# Patient Record
Sex: Male | Born: 1960 | Race: Asian | Hispanic: No | Marital: Married | Smoking: Never smoker
Health system: Southern US, Community
[De-identification: ages and names within clinical notes are randomized; demographics above are authoritative.]

## PROBLEM LIST (undated history)

## (undated) DIAGNOSIS — T7840XA Allergy, unspecified, initial encounter: Secondary | ICD-10-CM

## (undated) DIAGNOSIS — E079 Disorder of thyroid, unspecified: Secondary | ICD-10-CM

## (undated) DIAGNOSIS — E785 Hyperlipidemia, unspecified: Secondary | ICD-10-CM

## (undated) HISTORY — DX: Hyperlipidemia, unspecified: E78.5

## (undated) HISTORY — DX: Allergy, unspecified, initial encounter: T78.40XA

## (undated) HISTORY — DX: Disorder of thyroid, unspecified: E07.9

---

## 2000-06-26 ENCOUNTER — Encounter: Admission: RE | Admit: 2000-06-26 | Discharge: 2000-06-26 | Payer: Self-pay | Admitting: Family Medicine

## 2000-06-26 ENCOUNTER — Encounter: Payer: Self-pay | Admitting: Family Medicine

## 2006-05-08 ENCOUNTER — Emergency Department (HOSPITAL_COMMUNITY): Admission: EM | Admit: 2006-05-08 | Discharge: 2006-05-09 | Payer: Self-pay | Admitting: Emergency Medicine

## 2008-11-09 ENCOUNTER — Encounter: Admission: RE | Admit: 2008-11-09 | Discharge: 2008-11-09 | Payer: Self-pay | Admitting: Family Medicine

## 2013-03-09 ENCOUNTER — Other Ambulatory Visit (HOSPITAL_COMMUNITY): Payer: Self-pay | Admitting: Orthopedic Surgery

## 2013-03-09 DIAGNOSIS — M67431 Ganglion, right wrist: Secondary | ICD-10-CM

## 2013-03-19 ENCOUNTER — Other Ambulatory Visit (HOSPITAL_COMMUNITY): Payer: Self-pay

## 2013-03-19 ENCOUNTER — Ambulatory Visit (HOSPITAL_COMMUNITY): Payer: BC Managed Care – PPO

## 2013-03-20 ENCOUNTER — Ambulatory Visit (HOSPITAL_COMMUNITY)
Admission: RE | Admit: 2013-03-20 | Discharge: 2013-03-20 | Disposition: A | Discharge status: Home / Self Care | Payer: BC Managed Care – PPO | Source: Ambulatory Visit | Attending: Orthopedic Surgery | Admitting: Orthopedic Surgery

## 2013-03-20 ENCOUNTER — Other Ambulatory Visit (HOSPITAL_COMMUNITY): Payer: Self-pay | Admitting: Orthopedic Surgery

## 2013-03-20 DIAGNOSIS — M67431 Ganglion, right wrist: Secondary | ICD-10-CM

## 2013-03-20 DIAGNOSIS — M25539 Pain in unspecified wrist: Secondary | ICD-10-CM | POA: Insufficient documentation

## 2014-03-17 IMAGING — US US EXTREM UP *R* LTD
1 series · 14 of 14 positions shown · non-contrast
Comparison: None.

CLINICAL DATA: Right wrist pain while doing pushups.  Question
ganglion cyst.

ULTRASOUND RIGHT UPPER EXTREMITY LIMITED
TECHNIQUE: Ultrasound examination of the region of interest in the
right upper extremity was performed.

[Series 2: us extrem up *right* ltd · 0.07mm/px · 14 of 14 slices shown]
[im 1/14]
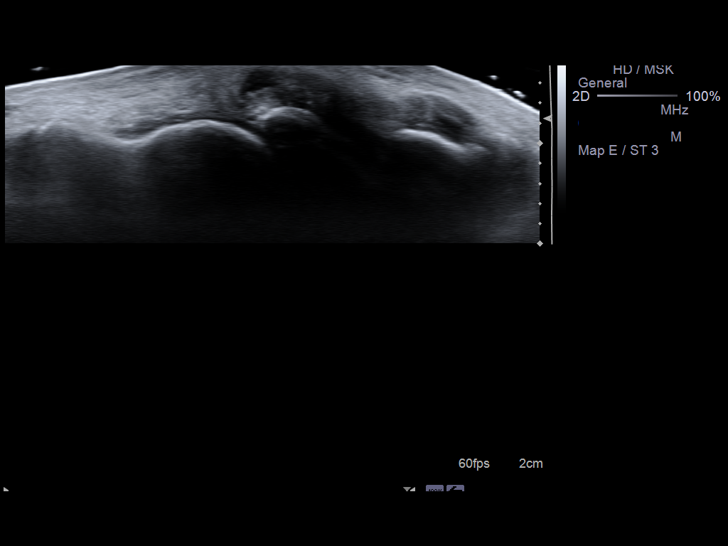
[im 2/14]
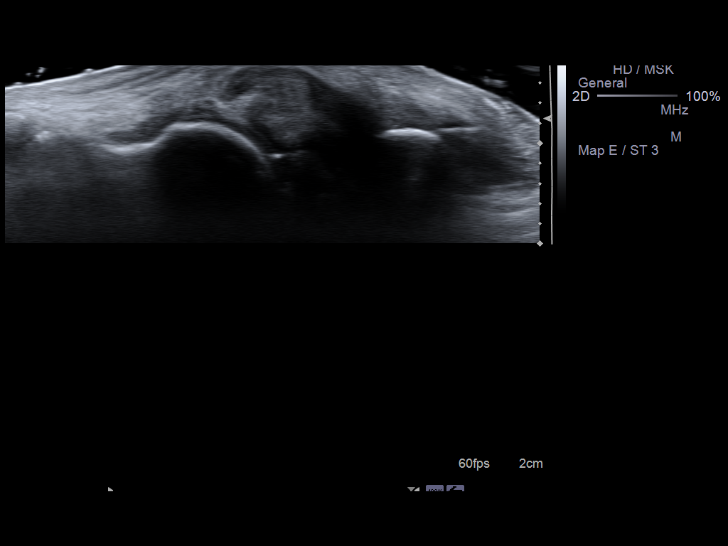
[im 3/14]
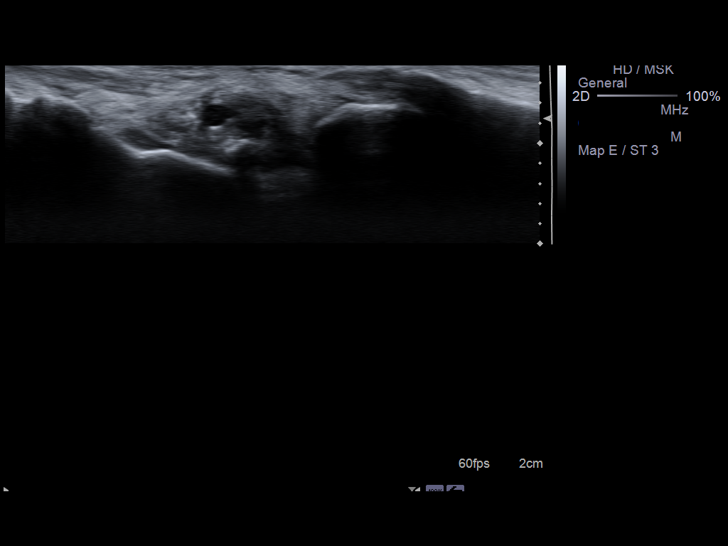
[im 4/14]
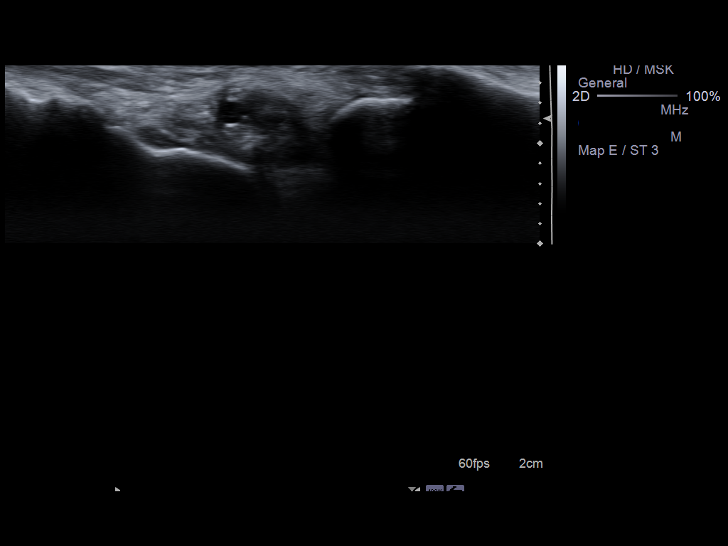
[im 5/14]
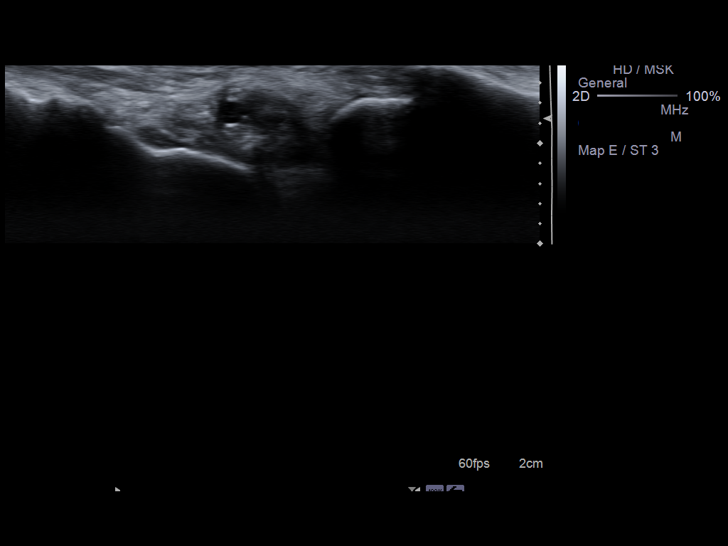
[im 6/14]
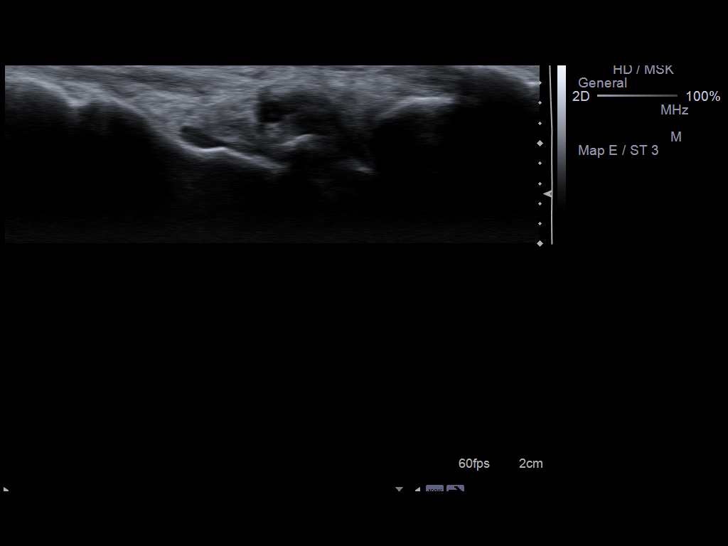
[im 7/14]
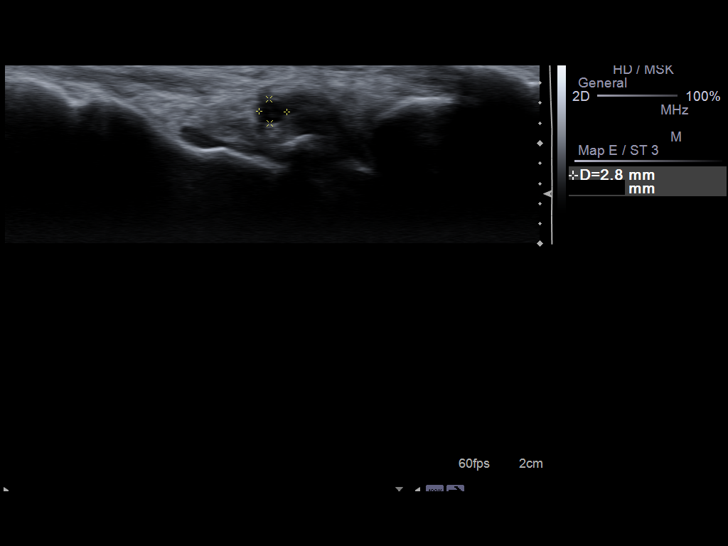
[im 8/14]
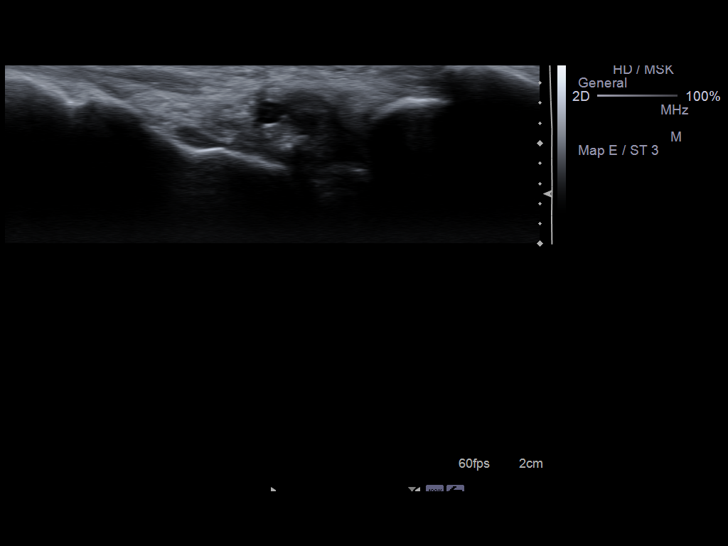
[im 9/14]
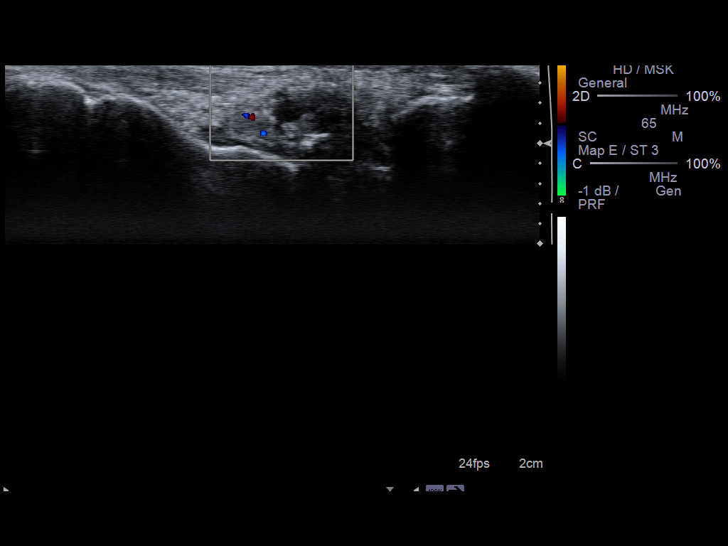
[im 10/14]
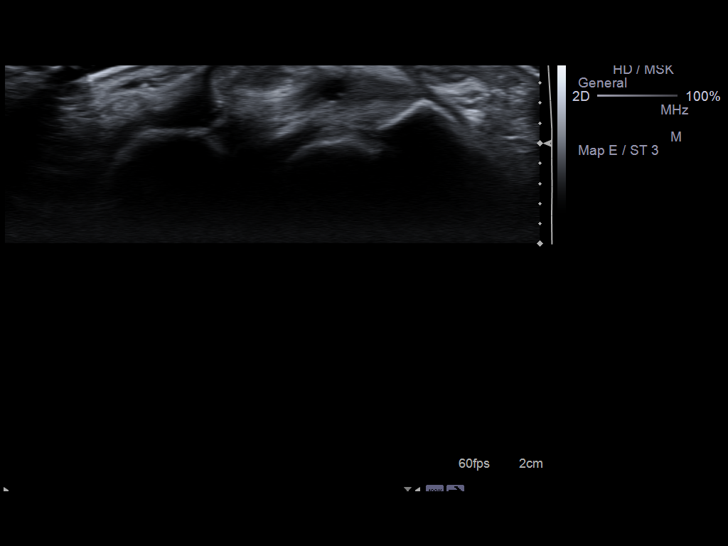
[im 11/14]
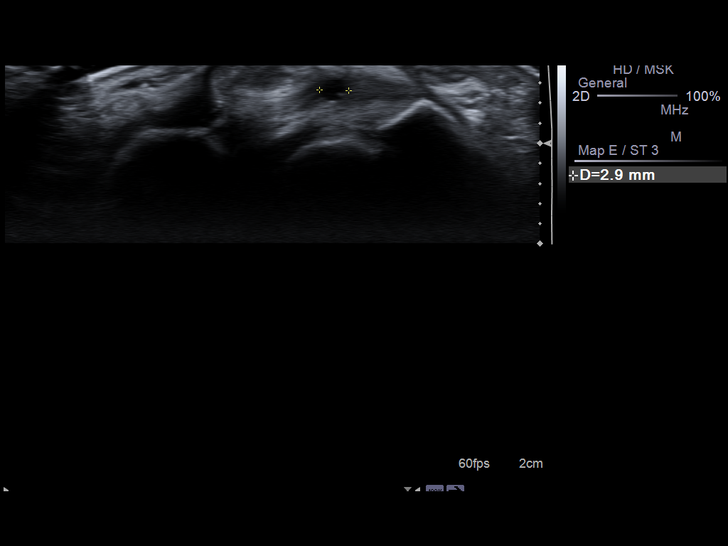
[im 12/14]
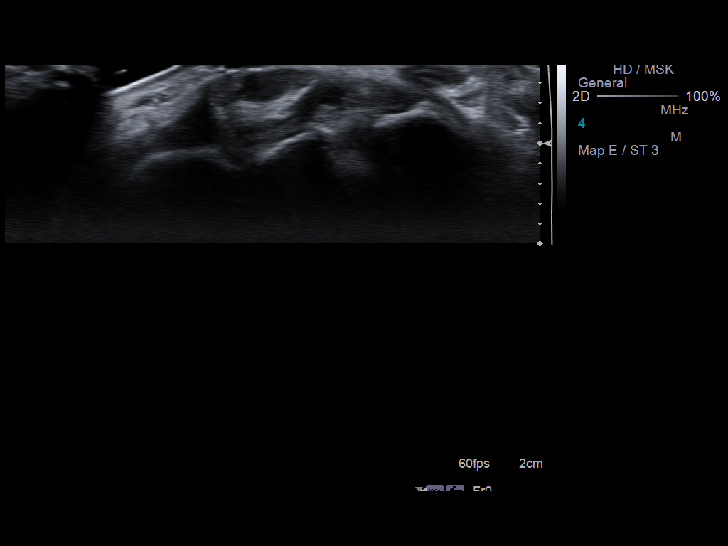
[im 13/14]
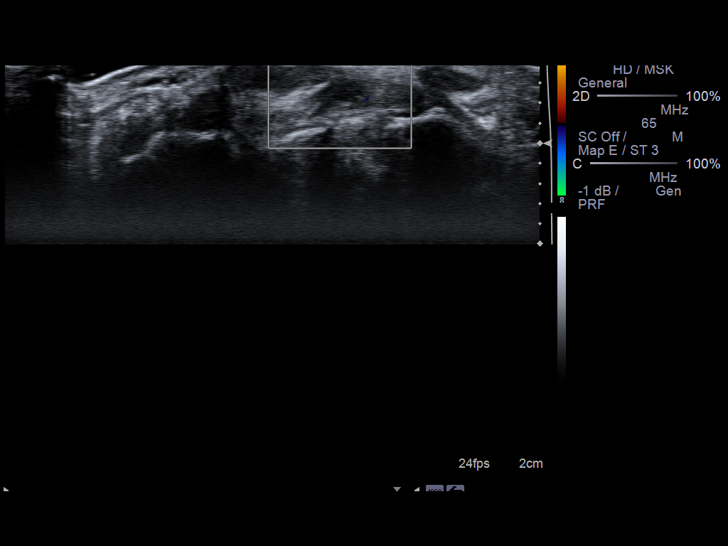
[im 14/14]
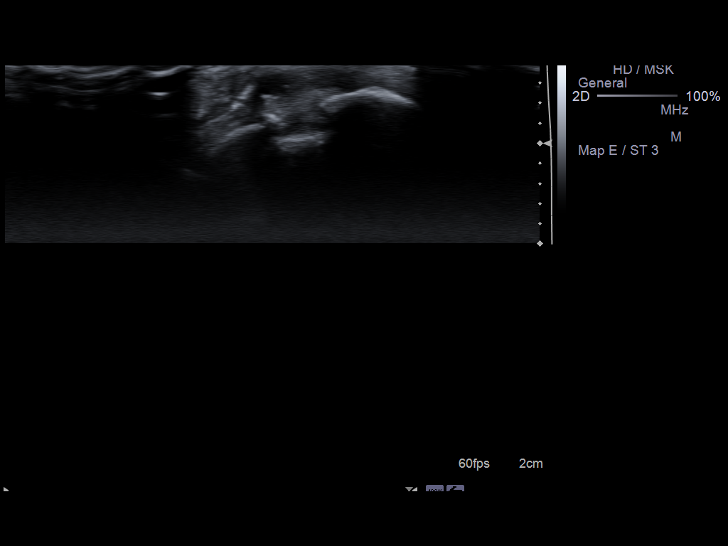

[14 of 14 positions shown; findings below may reference images not displayed]

FINDINGS: A tiny cyst measuring 0.3 cm in diameter is seen in the
region concern on the dorsum of the right wrist.  No other fluid
collection or abnormality is identified.
IMPRESSION: Findings compatible with a 0.3 cm ganglion cyst along the dorsum of
the right wrist.

## 2015-03-08 SURGERY — Surgical Case
Anesthesia: *Unknown

## 2016-09-11 DIAGNOSIS — E890 Postprocedural hypothyroidism: Secondary | ICD-10-CM | POA: Insufficient documentation

## 2019-12-16 DIAGNOSIS — M25571 Pain in right ankle and joints of right foot: Secondary | ICD-10-CM | POA: Diagnosis not present

## 2019-12-23 DIAGNOSIS — M25571 Pain in right ankle and joints of right foot: Secondary | ICD-10-CM | POA: Diagnosis not present

## 2019-12-30 DIAGNOSIS — M25571 Pain in right ankle and joints of right foot: Secondary | ICD-10-CM | POA: Diagnosis not present

## 2020-01-16 DIAGNOSIS — Z20828 Contact with and (suspected) exposure to other viral communicable diseases: Secondary | ICD-10-CM | POA: Diagnosis not present

## 2020-01-16 DIAGNOSIS — Z03818 Encounter for observation for suspected exposure to other biological agents ruled out: Secondary | ICD-10-CM | POA: Diagnosis not present

## 2020-05-24 DIAGNOSIS — Z20822 Contact with and (suspected) exposure to covid-19: Secondary | ICD-10-CM | POA: Diagnosis not present

## 2020-05-31 DIAGNOSIS — E890 Postprocedural hypothyroidism: Secondary | ICD-10-CM | POA: Diagnosis not present

## 2021-05-06 DIAGNOSIS — Z6820 Body mass index (BMI) 20.0-20.9, adult: Secondary | ICD-10-CM | POA: Diagnosis not present

## 2021-05-06 DIAGNOSIS — E039 Hypothyroidism, unspecified: Secondary | ICD-10-CM | POA: Diagnosis not present

## 2021-05-06 DIAGNOSIS — R053 Chronic cough: Secondary | ICD-10-CM | POA: Diagnosis not present

## 2021-05-06 DIAGNOSIS — Z0000 Encounter for general adult medical examination without abnormal findings: Secondary | ICD-10-CM | POA: Diagnosis not present

## 2021-05-06 DIAGNOSIS — Z1331 Encounter for screening for depression: Secondary | ICD-10-CM | POA: Diagnosis not present

## 2021-05-08 DIAGNOSIS — J929 Pleural plaque without asbestos: Secondary | ICD-10-CM | POA: Diagnosis not present

## 2021-05-08 DIAGNOSIS — R053 Chronic cough: Secondary | ICD-10-CM | POA: Diagnosis not present

## 2022-05-23 DIAGNOSIS — L0390 Cellulitis, unspecified: Secondary | ICD-10-CM | POA: Diagnosis not present

## 2022-05-23 DIAGNOSIS — U071 COVID-19: Secondary | ICD-10-CM | POA: Diagnosis not present

## 2022-05-23 DIAGNOSIS — B029 Zoster without complications: Secondary | ICD-10-CM | POA: Diagnosis not present

## 2022-05-23 DIAGNOSIS — E039 Hypothyroidism, unspecified: Secondary | ICD-10-CM | POA: Diagnosis not present

## 2022-08-16 DIAGNOSIS — Z6820 Body mass index (BMI) 20.0-20.9, adult: Secondary | ICD-10-CM | POA: Diagnosis not present

## 2022-08-16 DIAGNOSIS — E039 Hypothyroidism, unspecified: Secondary | ICD-10-CM | POA: Diagnosis not present

## 2022-08-16 DIAGNOSIS — Z0000 Encounter for general adult medical examination without abnormal findings: Secondary | ICD-10-CM | POA: Diagnosis not present

## 2022-08-16 DIAGNOSIS — Z1211 Encounter for screening for malignant neoplasm of colon: Secondary | ICD-10-CM | POA: Diagnosis not present

## 2022-08-28 ENCOUNTER — Encounter: Payer: Self-pay | Admitting: Internal Medicine

## 2022-09-25 ENCOUNTER — Ambulatory Visit (AMBULATORY_SURGERY_CENTER): Payer: BC Managed Care – PPO

## 2022-09-25 VITALS — Ht 67.0 in | Wt 137.0 lb

## 2022-09-25 DIAGNOSIS — Z1211 Encounter for screening for malignant neoplasm of colon: Secondary | ICD-10-CM

## 2022-09-25 MED ORDER — NA SULFATE-K SULFATE-MG SULF 17.5-3.13-1.6 GM/177ML PO SOLN: 1.0000 | Freq: Once | ORAL | 0 refills | Status: AC

## 2022-09-25 NOTE — Progress Notes (Signed)
No egg or soy allergy known to patient  No issues known to pt with past sedation with any surgeries or procedures Patient denies ever being told they had issues or difficulty with intubation  No FH of Malignant Hyperthermia Pt is not on diet pills Pt is not on home 02  Pt is not on blood thinners  Pt denies issues with constipation  No A fib or A flutter Have any cardiac testing pending--NO Pt instructed to use Single[email or web address] or GoodRx for a price reduction on prep   

## 2022-10-19 ENCOUNTER — Encounter: Payer: Self-pay | Admitting: Internal Medicine

## 2022-11-05 ENCOUNTER — Ambulatory Visit: Payer: BC Managed Care – PPO | Admitting: Internal Medicine

## 2022-11-05 ENCOUNTER — Encounter: Payer: Self-pay | Admitting: Internal Medicine

## 2022-11-05 VITALS — BP 116/68 | HR 75 | Temp 96.2°F | Resp 17 | Ht 67.0 in | Wt 135.0 lb

## 2022-11-05 DIAGNOSIS — Z1211 Encounter for screening for malignant neoplasm of colon: Secondary | ICD-10-CM

## 2022-11-05 DIAGNOSIS — D122 Benign neoplasm of ascending colon: Secondary | ICD-10-CM | POA: Diagnosis not present

## 2022-11-05 DIAGNOSIS — K635 Polyp of colon: Secondary | ICD-10-CM | POA: Diagnosis not present

## 2022-11-05 MED ORDER — SODIUM CHLORIDE 0.9 % IV SOLN: 500.0000 mL | Freq: Once | INTRAVENOUS | Status: DC

## 2022-11-05 NOTE — Progress Notes (Signed)
GASTROENTEROLOGY PROCEDURE H&P NOTE   Primary Care Physician: Pcp, No    Reason for Procedure:  Colon cancer screening  Plan:    Colonoscopy  Patient is appropriate for endoscopic procedure(s) in the ambulatory ([place]) setting.  The nature of the procedure, as well as the risks, benefits, and alternatives were carefully and thoroughly reviewed with the patient. Ample time for discussion and questions allowed. The patient understood, was satisfied, and agreed to proceed.     HPI: Dalton Bryan is a 62 y.o. male who presents for colonoscopy.  Medical history as below.  Tolerated the prep.  No recent chest pain or shortness of breath.  No abdominal pain today.  Past Medical History:  Diagnosis Date   Allergy    Hyperlipidemia    Thyroid disease     History reviewed. No pertinent surgical history.  Prior to Admission medications   Medication Sig Start Date End Date Taking? Authorizing Provider  glucosamine-chondroitin 500-400 MG tablet Take 1 tablet by mouth 3 (three) times daily.   Yes [provider]  levothyroxine (SYNTHROID) 112 MCG tablet TAKE 1 TABLET DAILY. Last refill. Patient needs follow up appointment for additional refills. 06/06/22  Yes [provider]  Multiple Vitamins-Minerals (MULTIVITAMIN WITH MINERALS) tablet Take 1 tablet by mouth daily.   Yes [provider]  Omega-3 Fatty Acids (FISH OIL) 300 MG CAPS Take by mouth.   Yes [provider]  calcium carbonate (OSCAL) 1500 (600 Ca) MG TABS tablet Take by mouth 2 (two) times daily with a meal.    [provider]    Current Outpatient Medications  Medication Sig Dispense Refill   glucosamine-chondroitin 500-400 MG tablet Take 1 tablet by mouth 3 (three) times daily.     levothyroxine (SYNTHROID) 112 MCG tablet TAKE 1 TABLET DAILY. Last refill. Patient needs follow up appointment for additional refills.     Multiple Vitamins-Minerals (MULTIVITAMIN WITH MINERALS) tablet  Take 1 tablet by mouth daily.     Omega-3 Fatty Acids (FISH OIL) 300 MG CAPS Take by mouth.     calcium carbonate (OSCAL) 1500 (600 Ca) MG TABS tablet Take by mouth 2 (two) times daily with a meal.     Current Facility-Administered Medications  Medication Dose Route Frequency Provider Last Rate Last Admin   0.9 %  sodium chloride infusion  500 mL Intravenous Once Jarome Trull, Lajuan Lines, MD        Allergies as of 11/05/2022   (Not on File)    Family History  Problem Relation Age of Onset   Colon cancer Neg Hx    Colon polyps Neg Hx    Esophageal cancer Neg Hx    Rectal cancer Neg Hx    Stomach cancer Neg Hx     Social History   Socioeconomic History   Marital status: Married    Spouse name: Not on file   Number of children: Not on file   Years of education: Not on file   Highest education level: Not on file  Occupational History   Not on file  Tobacco Use   Smoking status: Never    Passive exposure: Never   Smokeless tobacco: Never  Vaping Use   Vaping Use: Never used  Substance and Sexual Activity   Alcohol use: Never   Drug use: Never   Sexual activity: Not on file  Other Topics Concern   Not on file  Social History Narrative   Not on file   Social Determinants of Health  Financial Resource Strain: Not on file  Food Insecurity: Not on file  Transportation Needs: Not on file  Physical Activity: Not on file  Stress: Not on file  Social Connections: Not on file  Intimate Partner Violence: Not on file    Physical Exam: Vital signs in last 24 hours: '@BP'$  128/73   Pulse 79   Temp (!) 96.2 F (35.7 C)   Ht '5\' 7"'$  (1.702 m)   Wt 135 lb (61.2 kg)   SpO2 98%   BMI 21.14 kg/m  GEN: NAD EYE: Sclerae anicteric ENT: MMM CV: Non-tachycardic Pulm: CTA b/l GI: Soft, NT/ND NEURO:  Alert & Oriented x 3   Zenovia Jarred, MD [facility]  11/05/2022 4:20 PM

## 2022-11-05 NOTE — Op Note (Signed)
[facility] Patient Name: Dalton Bryan Procedure Date: 11/05/2022 4:20 PM MRN: [id number] Endoscopist: Jerene Bears , MD, [id number] Age: 62 Referring MD:  Date of Birth: 10-20-1960 Gender: Male Account #: [id number] Procedure:                Colonoscopy Indications:              Screening for colorectal malignant neoplasm, Last                            colonoscopy 10 years ago Medicines:                Monitored Anesthesia Care Procedure:                Pre-Anesthesia Assessment:                           - Prior to the procedure, a History and Physical                            was performed, and patient medications and                            allergies were reviewed. The patient's tolerance of                            previous anesthesia was also reviewed. The risks                            and benefits of the procedure and the sedation                            options and risks were discussed with the patient.                            All questions were answered, and informed consent                            was obtained. Prior Anticoagulants: The patient has                            taken no anticoagulant or antiplatelet agents. ASA                            Grade Assessment: I - A normal, healthy patient.                            After reviewing the risks and benefits, the patient                            was deemed in satisfactory condition to undergo the                            procedure.  After obtaining informed consent, the colonoscope                            was passed under direct vision. Throughout the                            procedure, the patient's blood pressure, pulse, and                            oxygen saturations were monitored continuously. The                            Olympus [id number]) Colonoscope was                            introduced through the anus and advanced to the                             cecum, identified by appendiceal orifice and                            ileocecal valve. The colonoscopy was performed                            without difficulty. The patient tolerated the                            procedure well. The quality of the bowel                            preparation was good. The ileocecal valve,                            appendiceal orifice, and rectum were photographed. Scope In: 4:29:05 PM Scope Out: [id number] PM Scope Withdrawal Time: 0 hours 12 minutes 26 seconds  Total Procedure Duration: 0 hours 17 minutes 27 seconds  Findings:                 The digital rectal exam was normal.                           Two sessile polyps were found in the ascending                            colon. The polyps were 4 to 5 mm in size. These                            polyps were removed with a cold snare. Resection                            and retrieval were complete.                           Internal hemorrhoids were found during  retroflexion. The hemorrhoids were small.                           The exam was otherwise without abnormality. Complications:            No immediate complications. Estimated Blood Loss:     Estimated blood loss was minimal. Impression:               - Two 4 to 5 mm polyps in the ascending colon,                            removed with a cold snare. Resected and retrieved.                           - Internal hemorrhoids.                           - The examination was otherwise normal. Recommendation:           - Patient has a contact number available for                            emergencies. The signs and symptoms of potential                            delayed complications were discussed with the                            patient. Return to normal activities tomorrow.                            Written discharge instructions were provided to the                            patient.                            - Resume previous diet.                           - Continue present medications.                           - Await pathology results.                           - Repeat colonoscopy is recommended. The                            colonoscopy date will be determined after pathology                            results from today's exam become available for                            review. Jerene Bears, MD 11/05/2022 4:49:06 PM This report has been signed electronically.

## 2022-11-05 NOTE — Patient Instructions (Addendum)
- Resume previous diet.                           - Continue present medications.                           - Await pathology results.                           - Repeat colonoscopy is recommended. The                            colonoscopy date will be determined after pathology                            results from today's exam become available for                            review.  2 polyps removed and sent to pathology.  Await results for final recommendations.  Handouts on findings given to patient.  ( Polyps and hemorrhoids)  YOU HAD AN ENDOSCOPIC PROCEDURE TODAY AT [facility]:   Refer to the procedure report that was given to you for any specific questions about what was found during the examination.  If the procedure report does not answer your questions, please call your gastroenterologist to clarify.  If you requested that your care partner not be given the details of your procedure findings, then the procedure report has been included in a sealed envelope for you to review at your convenience later.  YOU SHOULD EXPECT: Some feelings of bloating in the abdomen. Passage of more gas than usual.  Walking can help get rid of the air that was put into your GI tract during the procedure and reduce the bloating. If you had a lower endoscopy (such as a colonoscopy or flexible sigmoidoscopy) you may notice spotting of blood in your stool or on the toilet paper. If you underwent a bowel prep for your procedure, you may not have a normal bowel movement for a few days.  Please Note:  You might notice some irritation and congestion in your nose or some drainage.  This is from the oxygen used during your procedure.  There is no need for concern and it should clear up in a day or so.  SYMPTOMS TO REPORT IMMEDIATELY:  Following lower endoscopy (colonoscopy or flexible sigmoidoscopy):  Excessive amounts of blood in the stool  Significant tenderness or  worsening of abdominal pains  Swelling of the abdomen that is new, acute  Fever of 100F or higher   For urgent or emergent issues, a gastroenterologist can be reached at any hour by calling [phone]. Do not use MyChart messaging for urgent concerns.    DIET:  We do recommend a small meal at first, but then you may proceed to your regular diet.  Drink plenty of fluids but you should avoid alcoholic beverages for 24 hours.  ACTIVITY:  You should plan to take it easy for the rest of today and you should NOT DRIVE or use heavy machinery until tomorrow (because of the sedation medicines used during the test).    FOLLOW UP: Our staff will call the number listed on your records the next business day following  your procedure.  We will call around 7:15- 8:00 am to check on you and address any questions or concerns that you may have regarding the information given to you following your procedure. If we do not reach you, we will leave a message.     If any biopsies were taken you will be contacted by phone or by letter within the next 1-3 weeks.  Please call us at [phone] if you have not heard about the biopsies in 3 weeks.    SIGNATURES/CONFIDENTIALITY: You and/or your care partner have signed paperwork which will be entered into your electronic medical record.  These signatures attest to the fact that that the information above on your After Visit Summary has been reviewed and is understood.  Full responsibility of the confidentiality of this discharge information lies with you and/or your care-partner.

## 2022-11-05 NOTE — Progress Notes (Signed)
Pt's states no medical or surgical changes since previsit or office visit. 

## 2022-11-06 ENCOUNTER — Telehealth: Payer: Self-pay

## 2022-11-06 NOTE — Telephone Encounter (Signed)
  Follow up Call-     11/05/2022    3:17 PM  Call back number  Post procedure Call Back phone  # [phone]  Permission to leave phone message Yes     Patient questions:  Do you have a fever, pain , or abdominal swelling? No. Pain Score  0 *  Have you tolerated food without any problems? Yes.    Have you been able to return to your normal activities? Yes.    Do you have any questions about your discharge instructions: Diet   No. Medications  No. Follow up visit  No.  Do you have questions or concerns about your Care? No.  Actions: * If pain score is 4 or above: No action needed, pain <4.

## 2022-11-12 ENCOUNTER — Encounter: Payer: Self-pay | Admitting: Internal Medicine
